# Patient Record
Sex: Male | Born: 1975 | Race: Black or African American | Hispanic: No | Marital: Single | Smoking: Never smoker
Health system: Southern US, Community
[De-identification: ages and names within clinical notes are randomized; demographics above are authoritative.]

---

## 2020-11-10 ENCOUNTER — Emergency Department (HOSPITAL_COMMUNITY)
Admission: EM | Admit: 2020-11-10 | Discharge: 2020-11-11 | Disposition: A | Discharge status: Home / Self Care | Payer: Medicaid - Out of State | Attending: Emergency Medicine | Admitting: Emergency Medicine

## 2020-11-10 ENCOUNTER — Emergency Department (HOSPITAL_COMMUNITY): Payer: Medicaid - Out of State

## 2020-11-10 ENCOUNTER — Encounter (HOSPITAL_COMMUNITY): Payer: Self-pay | Admitting: Emergency Medicine

## 2020-11-10 ENCOUNTER — Other Ambulatory Visit: Payer: Self-pay

## 2020-11-10 DIAGNOSIS — R079 Chest pain, unspecified: Secondary | ICD-10-CM | POA: Insufficient documentation

## 2020-11-10 DIAGNOSIS — Z20822 Contact with and (suspected) exposure to covid-19: Secondary | ICD-10-CM | POA: Insufficient documentation

## 2020-11-10 DIAGNOSIS — Y903 Blood alcohol level of 60-79 mg/100 ml: Secondary | ICD-10-CM | POA: Insufficient documentation

## 2020-11-10 DIAGNOSIS — Y92410 Unspecified street and highway as the place of occurrence of the external cause: Secondary | ICD-10-CM | POA: Insufficient documentation

## 2020-11-10 DIAGNOSIS — R109 Unspecified abdominal pain: Secondary | ICD-10-CM | POA: Insufficient documentation

## 2020-11-10 DIAGNOSIS — R4182 Altered mental status, unspecified: Secondary | ICD-10-CM | POA: Insufficient documentation

## 2020-11-10 DIAGNOSIS — R52 Pain, unspecified: Secondary | ICD-10-CM

## 2020-11-10 LAB — URINALYSIS, ROUTINE W REFLEX MICROSCOPIC
Bacteria, UA: NONE SEEN
Bilirubin Urine: NEGATIVE
Glucose, UA: NEGATIVE mg/dL
Ketones, ur: NEGATIVE mg/dL
Leukocytes,Ua: NEGATIVE
Nitrite: NEGATIVE
Protein, ur: NEGATIVE mg/dL
Specific Gravity, Urine: 1.02 (ref 1.005–1.030)
pH: 5 (ref 5.0–8.0)

## 2020-11-10 LAB — COMPREHENSIVE METABOLIC PANEL
ALT: 23 U/L (ref 0–44)
AST: 39 U/L (ref 15–41)
Albumin: 3.8 g/dL (ref 3.5–5.0)
Alkaline Phosphatase: 69 U/L (ref 38–126)
Anion gap: 14 (ref 5–15)
BUN: 10 mg/dL (ref 6–20)
CO2: 19 mmol/L — ABNORMAL LOW (ref 22–32)
Calcium: 8.6 mg/dL — ABNORMAL LOW (ref 8.9–10.3)
Chloride: 105 mmol/L (ref 98–111)
Creatinine, Ser: 1.52 mg/dL — ABNORMAL HIGH (ref 0.61–1.24)
GFR, Estimated: 58 mL/min — ABNORMAL LOW (ref >60)
Glucose, Bld: 89 mg/dL (ref 70–99)
Potassium: 3.9 mmol/L (ref 3.5–5.1)
Sodium: 138 mmol/L (ref 135–145)
Total Bilirubin: 0.5 mg/dL (ref 0.3–1.2)
Total Protein: 6.8 g/dL (ref 6.5–8.1)

## 2020-11-10 LAB — SAMPLE TO BLOOD BANK

## 2020-11-10 LAB — I-STAT CHEM 8, ED
BUN: 9 mg/dL (ref 6–20)
Calcium, Ion: 1.06 mmol/L — ABNORMAL LOW (ref 1.15–1.40)
Chloride: 107 mmol/L (ref 98–111)
Creatinine, Ser: 1.6 mg/dL — ABNORMAL HIGH (ref 0.61–1.24)
Glucose, Bld: 87 mg/dL (ref 70–99)
HCT: 39 % (ref 39.0–52.0)
Hemoglobin: 13.3 g/dL (ref 13.0–17.0)
Potassium: 3.9 mmol/L (ref 3.5–5.1)
Sodium: 141 mmol/L (ref 135–145)
TCO2: 19 mmol/L — ABNORMAL LOW (ref 22–32)

## 2020-11-10 LAB — TROPONIN I (HIGH SENSITIVITY): Troponin I (High Sensitivity): 7 ng/L (ref <18)

## 2020-11-10 LAB — RESP PANEL BY RT-PCR (FLU A&B, COVID) ARPGX2
Influenza A by PCR: NEGATIVE
Influenza B by PCR: NEGATIVE
SARS Coronavirus 2 by RT PCR: NEGATIVE

## 2020-11-10 LAB — PROTIME-INR
INR: 1 (ref 0.8–1.2)
Prothrombin Time: 13 seconds (ref 11.4–15.2)

## 2020-11-10 LAB — CBC
HCT: 39 % (ref 39.0–52.0)
Hemoglobin: 13 g/dL (ref 13.0–17.0)
MCH: 30 pg (ref 26.0–34.0)
MCHC: 33.3 g/dL (ref 30.0–36.0)
MCV: 90.1 fL (ref 80.0–100.0)
Platelets: 333 10*3/uL (ref 150–400)
RBC: 4.33 MIL/uL (ref 4.22–5.81)
RDW: 14.3 % (ref 11.5–15.5)
WBC: 6 10*3/uL (ref 4.0–10.5)
nRBC: 0 % (ref 0.0–0.2)

## 2020-11-10 LAB — ETHANOL: Alcohol, Ethyl (B): 60 mg/dL — ABNORMAL HIGH (ref <10)

## 2020-11-10 MED ORDER — SODIUM CHLORIDE 0.9 % IV SOLN
INTRAVENOUS | Status: AC | PRN
  Administered 2020-11-10: 1000 mL via INTRAVENOUS

## 2020-11-10 MED ORDER — LACTATED RINGERS IV BOLUS
1000.0000 mL | Freq: Once | INTRAVENOUS | Status: AC
  Administered 2020-11-10: 1000 mL via INTRAVENOUS

## 2020-11-10 MED ORDER — IOHEXOL 350 MG/ML SOLN
100.0000 mL | Freq: Once | INTRAVENOUS | Status: AC | PRN
  Administered 2020-11-10: 100 mL via INTRAVENOUS

## 2020-11-10 NOTE — Discharge Instructions (Signed)
Your creatinine was slightly elevated today.  This is a measure of kidney function. Please drink plenty of fluids and have this rechecked next week as an outpatient
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
## 2020-11-10 NOTE — ED Notes (Signed)
Port xray notified pt is now back in room for remainder of xrays
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
## 2020-11-10 NOTE — ED Notes (Signed)
Transported to CT scan
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
## 2020-11-10 NOTE — ED Notes (Signed)
All patient belongings ( two rings and watch ) placed in biohazard bag with pt labels on it.

## 2020-11-11 LAB — TROPONIN I (HIGH SENSITIVITY): Troponin I (High Sensitivity): 18 ng/L — ABNORMAL HIGH (ref <18)
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
## 2020-11-15 LAB — CDS SEROLOGY

## 2022-09-17 IMAGING — CT CT CHEST W/ CM
2 of 5 series · 13 of 36 positions shown, 16 images · IV contrast (APPLIED)
Comparison: None.

CLINICAL DATA: Abdominal trauma

EXAM:
CT CHEST, ABDOMEN, AND PELVIS WITH CONTRAST
TECHNIQUE: Multidetector CT imaging of the chest, abdomen and pelvis was
performed following the standard protocol during bolus
administration of intravenous contrast.
CONTRAST:  100mL OMNIPAQUE IOHEXOL 350 MG/ML SOLN

[Series 17: cor · coronal · 0.75mm/px · 3 of 85 slices shown]
[im 17/85  lung]
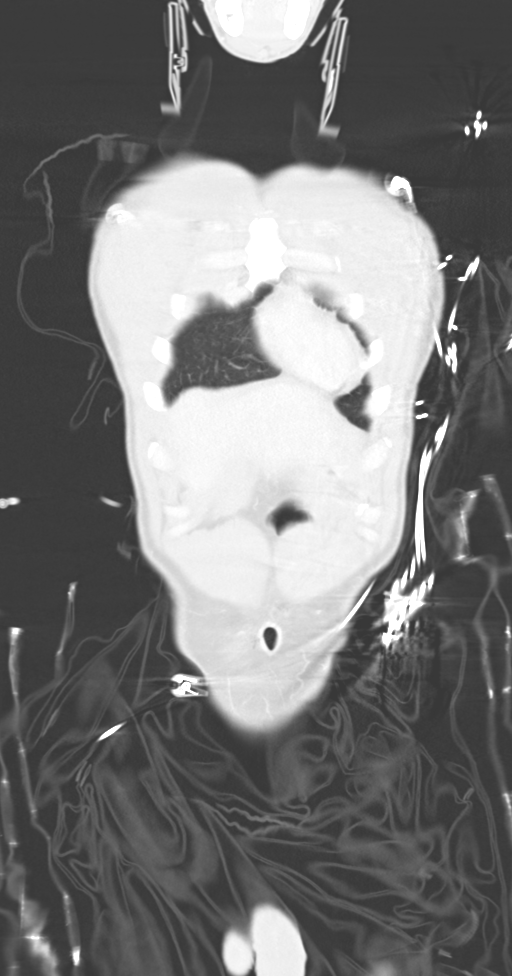
[im 34/85  lung]
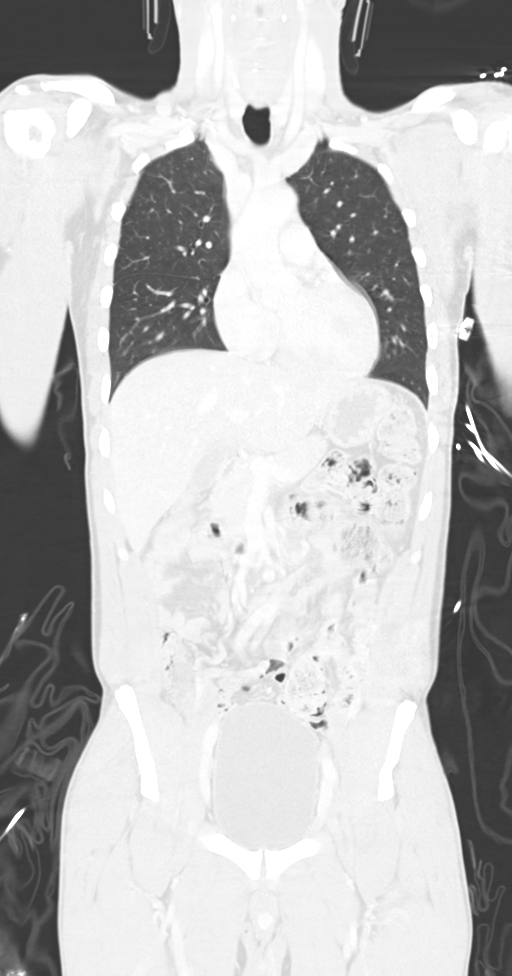
[im 51/85  lung]
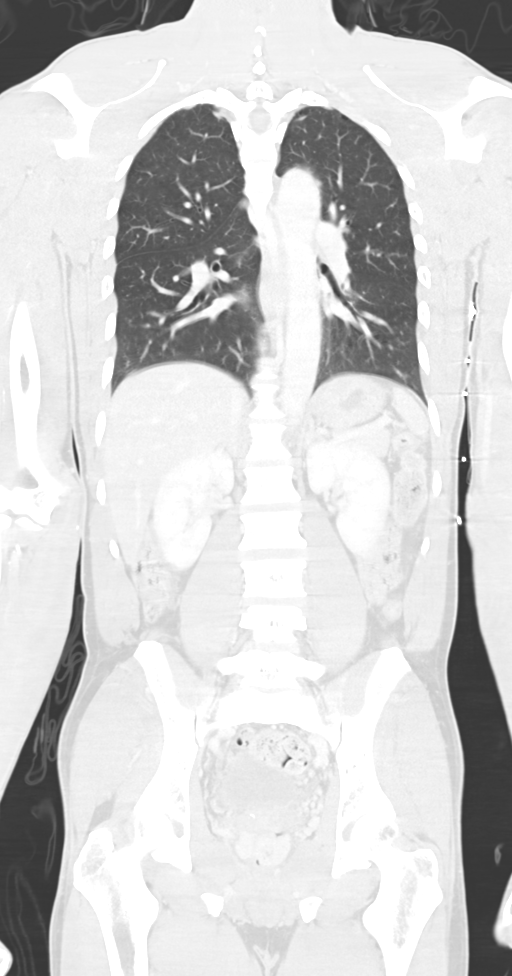

[Series 19: axial · axial · 0.64mm/px · z∈[+494,+1118]mm · 10 of 241 slices shown, 13 images]
[im 17/241  mediastinal]
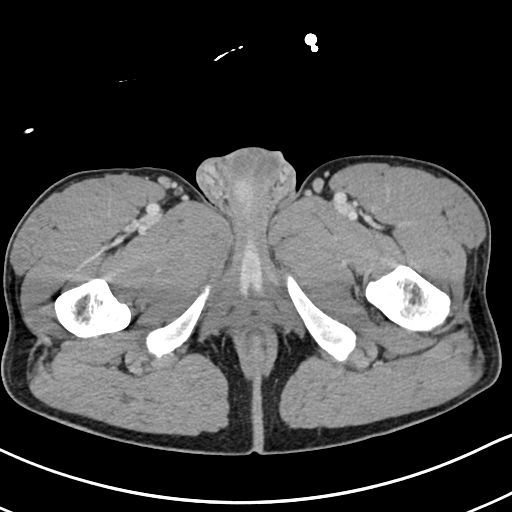
[im 17/241  lung]
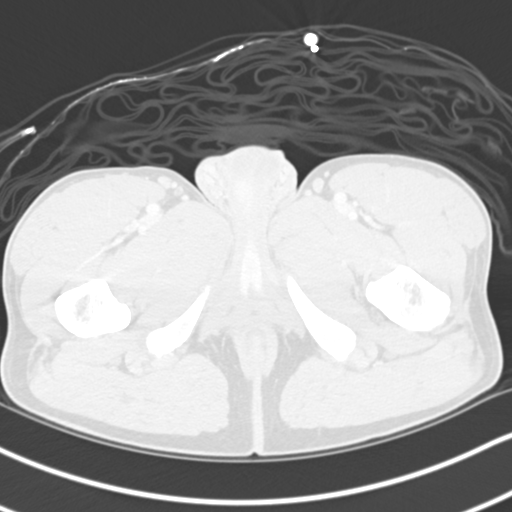
[im 49/241  lung]
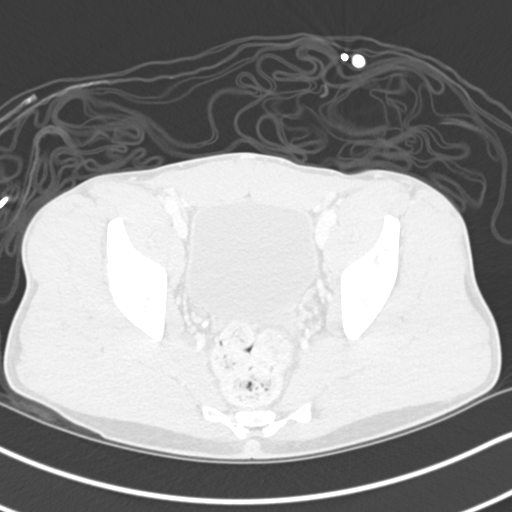
[im 65/241  lung]
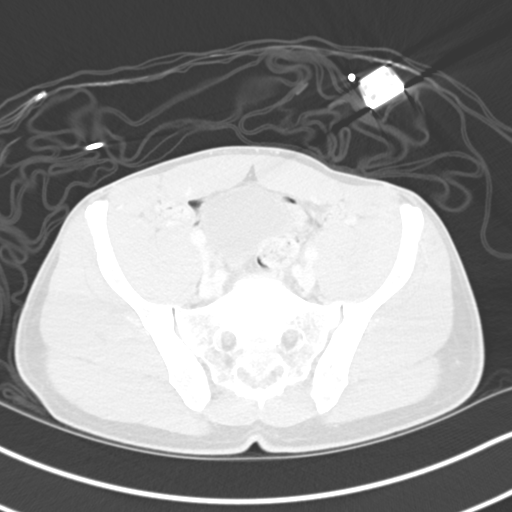
[im 81/241  lung]
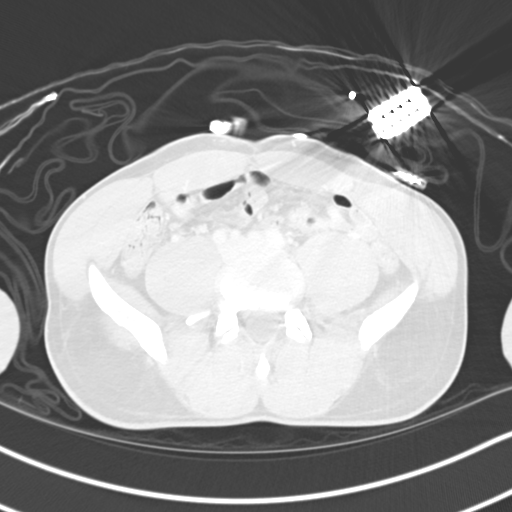
[im 113/241  mediastinal]
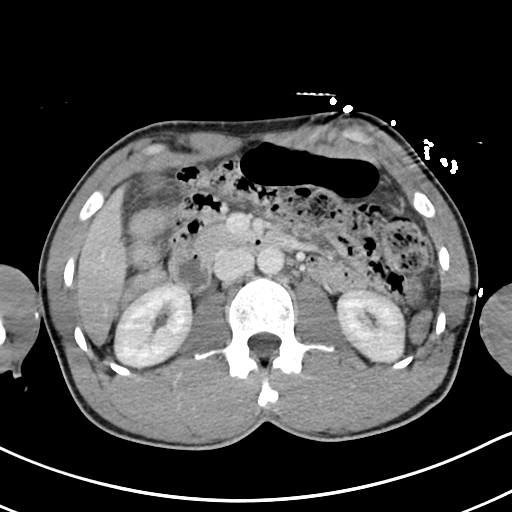
[im 113/241  lung]
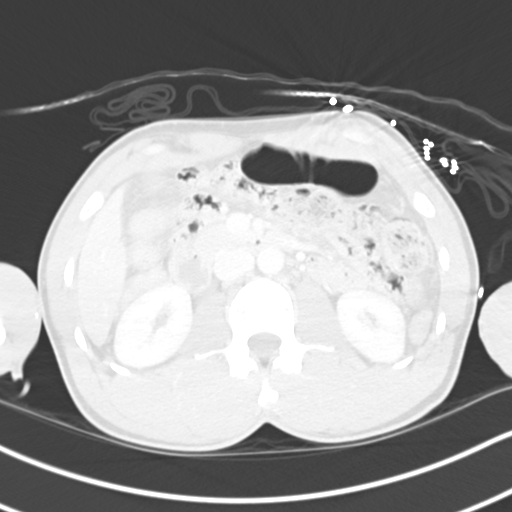
[im 129/241  lung]
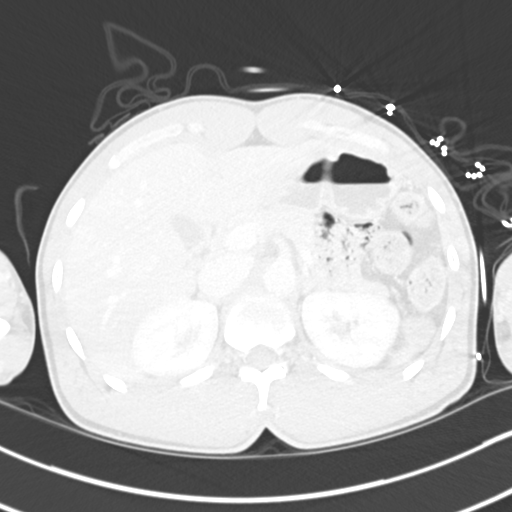
[im 161/241  lung]
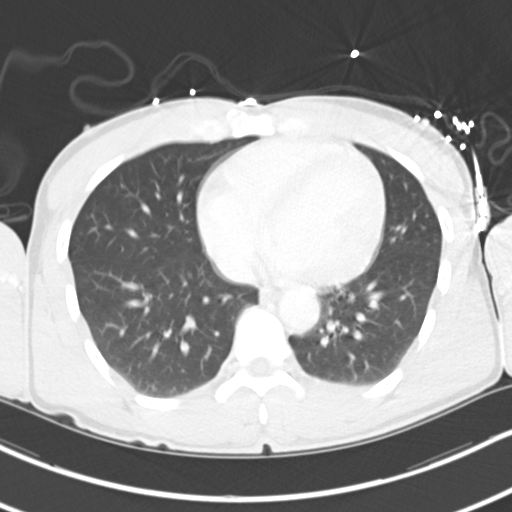
[im 177/241  lung]
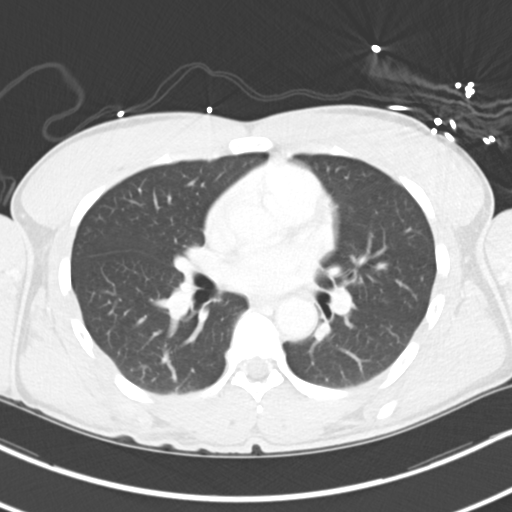
[im 193/241  mediastinal]
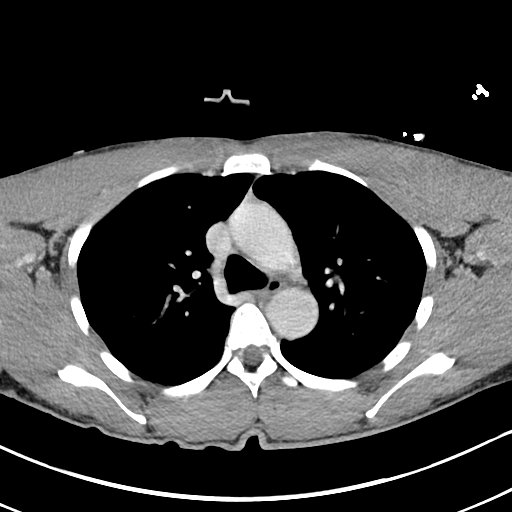
[im 193/241  lung]
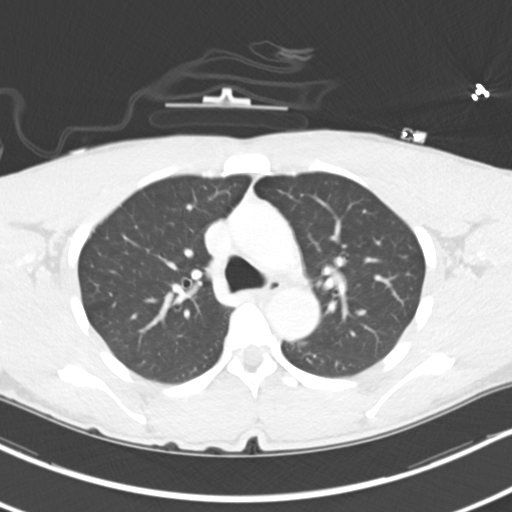
[im 225/241  lung]
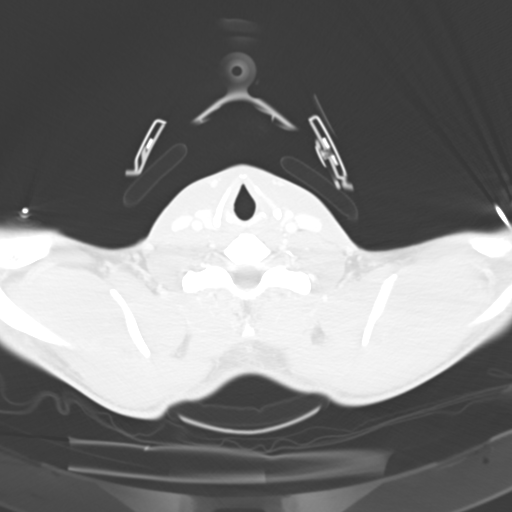

[13 of 36 positions shown; findings below may reference images not displayed]

FINDINGS: CT CHEST FINDINGS

Cardiovascular: Heart is normal size. Aorta is normal caliber.

Mediastinum/Nodes: No mediastinal, hilar, or axillary adenopathy.
Trachea and esophagus are unremarkable. Thyroid unremarkable. No
mediastinal hematoma.

Lungs/Pleura: Biapical scarring. Lungs are clear. No focal airspace
opacities or suspicious nodules. No effusions. No pneumothorax.

Musculoskeletal: Densities are seen in the right axillary region
adjacent to the vessels. This is of unknown etiology. It is unclear
if this is metallic foreign bodies or calcifications. No acute bony
abnormality.

CT ABDOMEN PELVIS FINDINGS

Hepatobiliary: No hepatic injury or perihepatic hematoma.
Gallbladder is unremarkable.

Pancreas: No focal abnormality or ductal dilatation.

Spleen: No splenic injury or perisplenic hematoma.

Adrenals/Urinary Tract: No adrenal hemorrhage or renal injury
identified. Bladder is unremarkable.

Stomach/Bowel: Stomach, large and small bowel grossly unremarkable.

Vascular/Lymphatic: No evidence of aneurysm or adenopathy.

Reproductive: No visible focal abnormality.

Other: No free fluid or free air.

Musculoskeletal: No acute bony abnormality.
IMPRESSION: No evidence of significant traumatic injury in the chest, abdomen or
pelvis. No acute findings.

Densities are seen in the right axillary region adjacent to the
vessels. It is unclear if this represents calcifications or metallic
foreign bodies. Recommend clinical correlation.
